# Patient Record
Sex: Male | Born: 1995 | Race: White | Hispanic: No | Marital: Single | Smoking: Never smoker
Health system: Southern US, Community
[De-identification: ages and names within clinical notes are randomized; demographics above are authoritative.]

## PROBLEM LIST (undated history)

## (undated) DIAGNOSIS — F909 Attention-deficit hyperactivity disorder, unspecified type: Secondary | ICD-10-CM

## (undated) HISTORY — PX: ULNAR COLLATERAL LIGAMENT REPAIR: SHX6159

## (undated) HISTORY — DX: Attention-deficit hyperactivity disorder, unspecified type: F90.9

## (undated) HISTORY — PX: ORIF ELBOW FRACTURE: SUR928

---
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
## 2014-03-03 ENCOUNTER — Other Ambulatory Visit: Payer: Self-pay

## 2014-03-03 DIAGNOSIS — F988 Other specified behavioral and emotional disorders with onset usually occurring in childhood and adolescence: Secondary | ICD-10-CM

## 2014-03-03 NOTE — Telephone Encounter (Signed)
The patient's mother called about a prescription refill for her son's Ativan.  CB#: [phone]

## 2014-03-04 MED ORDER — METHYLPHENIDATE HCL 10 MG PO TABS: 10.0000 mg | ORAL_TABLET | Freq: Two times a day (BID) | ORAL | Status: DC

## 2014-03-04 NOTE — Telephone Encounter (Signed)
Could you write this for me since I am gone for 4 days.  Thanks.

## 2014-03-04 NOTE — Telephone Encounter (Signed)
Ritalin and not Ativan

## 2014-03-04 NOTE — Telephone Encounter (Signed)
Notified mother ready. 

## 2014-03-04 NOTE — Telephone Encounter (Signed)
Meds ordered this encounter  Medications  . methylphenidate (RITALIN) 10 MG tablet    Sig: Take 1 tablet (10 mg total) by mouth 2 (two) times daily.    Dispense:  60 tablet    Refill:  0

## 2014-07-19 ENCOUNTER — Telehealth: Payer: Self-pay

## 2014-07-19 DIAGNOSIS — F988 Other specified behavioral and emotional disorders with onset usually occurring in childhood and adolescence: Secondary | ICD-10-CM

## 2014-07-19 NOTE — Telephone Encounter (Signed)
pts mom states son needs adderall refill    Best phone for mom is [phone]

## 2014-07-21 NOTE — Telephone Encounter (Signed)
We have not seen the patient since 4/15 and at that time he was put on Ritalin and it looks like his last Rx was 02/2014.  Could we get more information please.  He needs to be seen.

## 2014-07-22 NOTE — Telephone Encounter (Signed)
PATIENT'S MOTHER STATES SHE LEFT A PHONE MESSAGE ON  MARCH 29 TH ASKING TO GET A REFILL ON HER SON'S ADDERALL. SHE HAS NEVER HEARD BACK FROM US. I READ THE MESSAGE THAT [name] LEFT AND HIS MOTHER SAID THAT HE IS IN COLLEGE AND HE HAS NOT FILLED ANY PRESCRIPTIONS SINCE HE WAS IN THE OFFICE LAST. SHE SAID HE JUST NEEDS ENOUGH TO LAST UNTIL HE FINISHES HIS EXAMS AT THE END OF THE MONTH. THEN HE WILL BE ABLE TO COME IN TO HAVE A PHYSICAL.  BEST PHONE [phone] (MOTHER'S NAME IS LISA COOPER) PHARMACY CHOICE IS [facility] ([place]) [facility]

## 2014-07-25 NOTE — Telephone Encounter (Signed)
Gavin Torres states her son is having his exams and really need the ADDERALL to get through, will be in after that for his PE. Please call [phone]

## 2014-07-26 MED ORDER — METHYLPHENIDATE HCL 10 MG PO TABS: 10.0000 mg | ORAL_TABLET | Freq: Two times a day (BID) | ORAL | Status: DC

## 2014-07-26 NOTE — Telephone Encounter (Signed)
Rx printed. Authorized on Gavin Torres's behalf, as she is out of the office. She'll see Gavin Torres when he's home from school after exams. Encourage mom to go ahead and schedule that visit.  Meds ordered this encounter  Medications  . methylphenidate (RITALIN) 10 MG tablet    Sig: Take 1 tablet (10 mg total) by mouth 2 (two) times daily.    Dispense:  60 tablet    Refill:  0    Order Specific Question:  Supervising Provider    Answer:  DOOLITTLE, ROBERT P [[id number]]

## 2014-07-27 NOTE — Telephone Encounter (Signed)
Called mom advised Rx ready to pick up. Advised to make appt. Left message.

## 2014-12-06 ENCOUNTER — Telehealth: Payer: Self-pay | Admitting: Physician Assistant

## 2014-12-06 NOTE — Telephone Encounter (Signed)
Patient's mother called to see if Gavin Torres had a chance to review message regarding med refills. Patient is leaving the end of this week for college and they really need med before he leaves. Can another provider review message and respond? Cb# [phone].

## 2014-12-06 NOTE — Telephone Encounter (Signed)
Gavin Torres,  (MRN [id number]) Gavin Torres   Per mother [name] [phone]), which was not happy about the appointment being cancelled.  Patient is leaving for college on Saturday.  Due to clinic being cancelled on 12/07/2014 @ 3:45pm, patient will not be able to make another appointment.  I did offer Dr. Katrinka Blazing at 10:00 am, but patient could not make that appointment since he would be [place] from the beach.  Patient wanted to up his Ritalin which he only takes while studying otherwise he does fine while in class.    [name] would appreciate if you could please call her.  They will need the prescription by Friday to make sure it is filled before they leave on Saturday.  Pharmacy - [facility] - [phone] methylphenidate (RITALIN) 10 MG tablet

## 2014-12-07 ENCOUNTER — Ambulatory Visit: Payer: Self-pay | Admitting: Physician Assistant

## 2014-12-07 NOTE — Telephone Encounter (Signed)
Patient has not been seen here in well over a year. We apologize for [name]'s clinic being cancelled but we cannot refill medication without the patient being seen. If none of the open appointment slots work for them, they will need to come to the walk-in clinic.

## 2014-12-07 NOTE — Telephone Encounter (Signed)
Spoke with mother, she is very upset about this situation. I expressed my apologies. I stuck him to an appt slot with Gurney Maxin tomorrow at 1:15pm

## 2014-12-07 NOTE — Telephone Encounter (Signed)
I have reviewed this message and I am sorry that I had to cancel my appt but they were told In April that he needed an appt and the fact that he wants to increase his dose is even more reason for him to be seen.  He definitely does use sparingly and I think that am increase is ok but we do need to evaluate him since he is on a stimulant and we typically do this every 6 months and in April we made an exception due to him being away in college.

## 2014-12-08 ENCOUNTER — Ambulatory Visit (INDEPENDENT_AMBULATORY_CARE_PROVIDER_SITE_OTHER): Payer: BLUE CROSS/BLUE SHIELD | Admitting: Urgent Care

## 2014-12-08 ENCOUNTER — Encounter: Payer: Self-pay | Admitting: Urgent Care

## 2014-12-08 VITALS — BP 102/70 | HR 81 | Temp 98.2°F | Resp 16 | Ht 75.5 in | Wt 235.0 lb

## 2014-12-08 DIAGNOSIS — F909 Attention-deficit hyperactivity disorder, unspecified type: Secondary | ICD-10-CM | POA: Diagnosis not present

## 2014-12-08 DIAGNOSIS — F988 Other specified behavioral and emotional disorders with onset usually occurring in childhood and adolescence: Secondary | ICD-10-CM

## 2014-12-08 MED ORDER — METHYLPHENIDATE HCL 5 MG PO TABS: 5.0000 mg | ORAL_TABLET | Freq: Every day | ORAL | Status: DC

## 2014-12-08 MED ORDER — METHYLPHENIDATE HCL 10 MG PO TABS: 10.0000 mg | ORAL_TABLET | Freq: Two times a day (BID) | ORAL | Status: DC

## 2014-12-08 MED ORDER — METHYLPHENIDATE HCL 5 MG PO TABS: 5.0000 mg | ORAL_TABLET | Freq: Two times a day (BID) | ORAL | Status: DC

## 2014-12-08 MED ORDER — METHYLPHENIDATE HCL 10 MG PO TABS: 10.0000 mg | ORAL_TABLET | Freq: Every day | ORAL | Status: DC

## 2014-12-08 NOTE — Patient Instructions (Signed)
Methylphenidate tablets What is this medicine? METHYLPHENIDATE (meth il FEN i date) is used to treat attention-deficit hyperactivity disorder (ADHD). It is also used to treat narcolepsy. This medicine may be used for other purposes; ask your health care provider or pharmacist if you have questions. COMMON BRAND NAME(S): Methylin, Ritalin What should I tell my health care provider before I take this medicine? They need to know if you have any of these conditions: -anxiety or panic attacks -circulation problems in fingers and toes -glaucoma -hardening or blockages of the arteries or heart blood vessels -heart disease or a heart defect -high blood pressure -history of a drug or alcohol abuse problem -history of stroke -liver disease -mental illness -motor tics, family history or diagnosis of Tourette's syndrome -seizures -suicidal thoughts, plans, or attempt; a previous suicide attempt by you or a family member -thyroid disease -an unusual or allergic reaction to methylphenidate, other medicines, foods, dyes, or preservatives -pregnant or trying to get pregnant -breast-feeding How should I use this medicine? Take this medicine by mouth with a glass of water. Follow the directions on the prescription label. It is best to take this medicine 30 to 45 minutes before meals, unless your doctor tells you otherwise. Take your medicine at regular intervals. Usually the last dose of the day will be taken at least 4 to 6 hours before bedtime, so it will not interfere with sleep. Do not take your medicine more often than directed. A special MedGuide will be given to you by the pharmacist with each prescription and refill. Be sure to read this information carefully each time. Talk to your pediatrician regarding the use of this medicine in children. While this drug may be prescribed for children as young as 6 years of age for selected conditions, precautions do apply. Overdosage: If you think you have  taken too much of this medicine contact a poison control center or emergency room at once. NOTE: This medicine is only for you. Do not share this medicine with others. What if I miss a dose? If you miss a dose, take it as soon as you can. If it is almost time for your next dose, take only that dose. Do not take double or extra doses. What may interact with this medicine? Do not take this medicine with any of the following medications: -lithium -MAOIs like Carbex, Eldepryl, Marplan, Nardil, and Parnate -other stimulant medicines for attention disorders, weight loss, or to stay awake -procarbazine This medicine may also interact with the following medications: -atomoxetine -caffeine -certain medicines for blood pressure, heart disease, irregular heart beat -certain medicines for depression, anxiety, or psychotic disturbances -certain medicines for seizures like carbamazepine, phenobarbital, phenytoin -cold or allergy medicines -warfarin This list may not describe all possible interactions. Give your health care provider a list of all the medicines, herbs, non-prescription drugs, or dietary supplements you use. Also tell them if you smoke, drink alcohol, or use illegal drugs. Some items may interact with your medicine. What should I watch for while using this medicine? Visit your doctor or health care professional for regular checks on your progress. This prescription requires that you follow special procedures with your doctor and pharmacy. You will need to have a new written prescription from your doctor or health care professional every time you need a refill. This medicine may affect your concentration, or hide signs of tiredness. Until you know how this drug affects you, do not drive, ride a bicycle, use machinery, or do anything that needs mental alertness.   Tell your doctor or health care professional if this medicine loses its effects, or if you feel you need to take more than the  prescribed amount. Do not change the dosage without talking to your doctor or health care professional. For males, contact your doctor or health care professional right away if you have an erection that lasts longer than 4 hours or if it becomes painful. This may be a sign of a serious problem and must be treated right away to prevent permanent damage. Decreased appetite is a common side effect when starting this medicine. Eating small, frequent meals or snacks can help. Talk to your doctor if you continue to have poor eating habits. Height and weight growth of a child taking this medicine will be monitored closely. Do not take this medicine close to bedtime. It may prevent you from sleeping. If you are going to need surgery, a MRI, CT scan, or other procedure, tell your doctor that you are taking this medicine. You may need to stop taking this medicine before the procedure. Tell your doctor or healthcare professional right away if you notice unexplained wounds on your fingers and toes while taking this medicine. You should also tell your healthcare provider if you experience numbness or pain, changes in the skin color, or sensitivity to temperature in your fingers or toes. What side effects may I notice from receiving this medicine? Side effects that you should report to your doctor or health care professional as soon as possible: -allergic reactions like skin rash, itching or hives, swelling of the face, lips, or tongue -changes in vision -chest pain or chest tightness -fast, irregular heartbeat -fingers or toes feel numb, cool, painful -hallucination, loss of contact with reality -high blood pressure -males: prolonged or painful erection -seizures -severe headaches -shortness of breath -suicidal thoughts or other mood changes -trouble walking, dizziness, loss of balance or coordination -uncontrollable head, mouth, neck, arm, or leg movements -unusual bleeding or bruising Side effects that  usually do not require medical attention (report to your doctor or health care professional if they continue or are bothersome): -anxious -headache -loss of appetite -nausea, vomiting -trouble sleeping -weight loss This list may not describe all possible side effects. Call your doctor for medical advice about side effects. You may report side effects to FDA at 1-800-FDA-1088. Where should I keep my medicine? Keep out of the reach of children. This medicine can be abused. Keep your medicine in a safe place to protect it from theft. Do not share this medicine with anyone. Selling or giving away this medicine is dangerous and against the law. Store at room temperature between 15 and 30 degrees C (59 and 86 degrees F). Protect from light and moisture. Keep container tightly closed. Throw away any unused medicine after the expiration date. NOTE: This sheet is a summary. It may not cover all possible information. If you have questions about this medicine, talk to your doctor, pharmacist, or health care provider.  2015, Elsevier/Gold Standard. (2012-12-28 10:09:08)     Attention Deficit Hyperactivity Disorder Attention deficit hyperactivity disorder (ADHD) is a problem with behavior issues based on the way the brain functions (neurobehavioral disorder). It is a common reason for behavior and academic problems in school. SYMPTOMS  There are 3 types of ADHD. The 3 types and some of the symptoms include:  Inattentive.  Gets bored or distracted easily.  Loses or forgets things. Forgets to hand in homework.  Has trouble organizing or completing tasks.  Difficulty staying on  task.  An inability to organize daily tasks and school work.  Leaving projects, chores, or homework unfinished.  Trouble paying attention or responding to details. Careless mistakes.  Difficulty following directions. Often seems like is not listening.  Dislikes activities that require sustained attention (like chores  or homework).  Hyperactive-impulsive.  Feels like it is impossible to sit still or stay in a seat. Fidgeting with hands and feet.  Trouble waiting turn.  Talking too much or out of turn. Interruptive.  Speaks or acts impulsively.  Aggressive, disruptive behavior.  Constantly busy or on the go; noisy.  Often leaves seat when they are expected to remain seated.  Often runs or climbs where it is not appropriate, or feels very restless.  Combined.  Has symptoms of both of the above. Often children with ADHD feel discouraged about themselves and with school. They often perform well below their abilities in school. As children get older, the excess motor activities can calm down, but the problems with paying attention and staying organized persist. Most children do not outgrow ADHD but with good treatment can learn to cope with the symptoms. DIAGNOSIS  When ADHD is suspected, the diagnosis should be made by professionals trained in ADHD. This professional will collect information about the individual suspected of having ADHD. Information must be collected from various settings where the person lives, works, or attends school.  Diagnosis will include:  Confirming symptoms began in childhood.  Ruling out other reasons for the child's behavior.  The health care providers will check with the child's school and check their medical records.  They will talk to teachers and parents.  Behavior rating scales for the child will be filled out by those dealing with the child on a daily basis. A diagnosis is made only after all information has been considered. TREATMENT  Treatment usually includes behavioral treatment, tutoring or extra support in school, and stimulant medicines. Because of the way a person's brain works with ADHD, these medicines decrease impulsivity and hyperactivity and increase attention. This is different than how they would work in a person who does not have ADHD. Other  medicines used include antidepressants and certain blood pressure medicines. Most experts agree that treatment for ADHD should address all aspects of the person's functioning. Along with medicines, treatment should include structured classroom management at school. Parents should reward good behavior, provide constant discipline, and set limits. Tutoring should be available for the child as needed. ADHD is a lifelong condition. If untreated, the disorder can have long-term serious effects into adolescence and adulthood. HOME CARE INSTRUCTIONS   Often with ADHD there is a lot of frustration among family members dealing with the condition. Blame and anger are also feelings that are common. In many cases, because the problem affects the family as a whole, the entire family may need help. A therapist can help the family find better ways to handle the disruptive behaviors of the person with ADHD and promote change. If the person with ADHD is young, most of the therapist's work is with the parents. Parents will learn techniques for coping with and improving their child's behavior. Sometimes only the child with the ADHD needs counseling. Your health care providers can help you make these decisions.  Children with ADHD may need help learning how to organize. Some helpful tips include:  Keep routines the same every day from wake-up time to bedtime. Schedule all activities, including homework and playtime. Keep the schedule in a place where the person with ADHD  will often see it. Mark schedule changes as far in advance as possible.  Schedule outdoor and indoor recreation.  Have a place for everything and keep everything in its place. This includes clothing, backpacks, and school supplies.  Encourage writing down assignments and bringing home needed books. Work with your child's teachers for assistance in organizing school work.  Offer your child a well-balanced diet. Breakfast that includes a balance of whole  grains, protein, and fruits or vegetables is especially important for school performance. Children should avoid drinks with caffeine including:  Soft drinks.  Coffee.  Tea.  However, some older children (adolescents) may find these drinks helpful in improving their attention. Because it can also be common for adolescents with ADHD to become addicted to caffeine, talk with your health care provider about what is a safe amount of caffeine intake for your child.  Children with ADHD need consistent rules that they can understand and follow. If rules are followed, give small rewards. Children with ADHD often receive, and expect, criticism. Look for good behavior and praise it. Set realistic goals. Give clear instructions. Look for activities that can foster success and self-esteem. Make time for pleasant activities with your child. Give lots of affection.  Parents are their children's greatest advocates. Learn as much as possible about ADHD. This helps you become a stronger and better advocate for your child. It also helps you educate your child's teachers and instructors if they feel inadequate in these areas. Parent support groups are often helpful. A national group with local chapters is called Children and Adults with Attention Deficit Hyperactivity Disorder (CHADD). SEEK MEDICAL CARE IF:  Your child has repeated muscle twitches, cough, or speech outbursts.  Your child has sleep problems.  Your child has a marked loss of appetite.  Your child develops depression.  Your child has new or worsening behavioral problems.  Your child develops dizziness.  Your child has a racing heart.  Your child has stomach pains.  Your child develops headaches. SEEK IMMEDIATE MEDICAL CARE IF:  Your child has been diagnosed with depression or anxiety and the symptoms seem to be getting worse.  Your child has been depressed and suddenly appears to have increased energy or motivation.  You are worried  that your child is having a bad reaction to a medication he or she is taking for ADHD. Document Released: 03/29/2002 Document Revised: 04/13/2013 Document Reviewed: 12/14/2012 [facility] Patient Information 2015 [place], [place]. This information is not intended to replace advice given to you by your health care provider. Make sure you discuss any questions you have with your health care provider.

## 2014-12-08 NOTE — Progress Notes (Signed)
MRN: [id number] DOB: 27-Apr-1995  Subjective:   Gavin Torres is a 19 y.o. male presenting for chief complaint of Medication Refill  Patient has a longstanding diagnosis of ADD. Has been managed with Ritalin. Has also tried Concerta and Metadate in the past, switched from Concerta due to cost and did not tolerate Metadate. Patient is playing [employer] and presents forms from [employer] that need to be completed in order for patient to be qualified to play. Reports that he has done well with this medication. He has taken Ritalin since 07/2013. His history of ADD includes significant difficulty with procrastination, attention in getting school work done, completing tasks that require focus and attention to detail, difficulty holding conversations, interrupting others, losing important items such as ID and wallet regularly, significant distractibility, makes careless mistakes. He also fidgets and has difficulty staying still in his chair. However, he has not had the diagnosis of ADHD. The majority of his symptoms are significantly improved with his current medication. He denies mood swings, irritability, decreased appetite, heart racing or palpitations, n/v, abdominal pain, disturbance in his sleep. Patient is requesting a change to his dosing. Reports that he has taken his medication as previously prescribed (twice daily) but would like to try the dose just in the morning given that is when he has most of his classes are. Specifically, he would like a trial of  in the morning. Denies any other aggravating or relieving factors, no other questions or concerns.  Gavin Torres has a current medication list which includes the following prescription(s): methylphenidate and methylphenidate. Also is allergic to vicodin.  Gavin Torres  has a past medical history of ADHD (attention deficit hyperactivity disorder). Also  has past surgical history that includes Ulnar collateral ligament repair (Left) and ORIF elbow  fracture.  Objective:   Vitals: BP 102/70 mmHg  Pulse 81  Temp(Src) 98.2 F (36.8 C) (Oral)  Resp 16  Ht 6' 3.5" (1.918 m)  Wt 235 lb (106.595 kg)  BMI 28.98 kg/m2  Physical Exam  Constitutional: He is oriented to person, place, and time. He appears well-developed and well-nourished.  HENT:  Mouth/Throat: Oropharynx is clear and moist.  Eyes: Pupils are equal, round, and reactive to light. No scleral icterus.  Neck: Normal range of motion. Neck supple. No thyromegaly present.  Cardiovascular: Normal rate, regular rhythm and intact distal pulses.  Exam reveals no gallop and no friction rub.   No murmur heard. Pulmonary/Chest: No respiratory distress. He has no wheezes. He has no rales.  Abdominal: Soft. Bowel sounds are normal. He exhibits no distension and no mass. There is no tenderness.  Neurological: He is alert and oriented to person, place, and time.  Skin: Skin is warm and dry. No rash noted. No erythema. No pallor.  Psychiatric: His mood appears not anxious. His affect is not labile. His speech is not rapid and/or pressured and not tangential. He is not agitated, not aggressive and not hyperactive. He does not exhibit a depressed mood. He expresses no homicidal and no suicidal ideation.  Patient fidgets and has difficulty sitting still.    Assessment and Plan :   1. ADD (attention deficit disorder) - Stable, continue medical therapy with Ritalin. Will switch his dose to  in the morning. Script for 3 months was provided. Patient is to follow up with PA-[name], his PCP. I will complete his [employer] paperwork and send to appropriate staff. - methylphenidate (RITALIN) 10 MG tablet; Take 1 tablet (10 mg total) by mouth daily. Do not  fill before 02/07/2015.  Dispense: 30 tablet; Refill: 0 - methylphenidate (RITALIN) 5 MG tablet; Take 1 tablet (5 mg total) by mouth daily. Do not fill before 02/07/2015.  Dispense: 30 tablet; Refill: 0   Wallis Bamberg, PA-C [facility] [phone] 12/08/2014 9:17 PM

## 2015-04-25 ENCOUNTER — Telehealth: Payer: Self-pay

## 2015-04-25 DIAGNOSIS — F988 Other specified behavioral and emotional disorders with onset usually occurring in childhood and adolescence: Secondary | ICD-10-CM

## 2015-04-25 NOTE — Telephone Encounter (Signed)
Pt req. Refill on...  methylphenidate (RITALIN) 10 MG tablet   [phone]

## 2015-04-27 MED ORDER — METHYLPHENIDATE HCL 10 MG PO TABS: 10.0000 mg | ORAL_TABLET | Freq: Every day | ORAL | Status: DC

## 2015-04-27 MED ORDER — METHYLPHENIDATE HCL 5 MG PO TABS: 5.0000 mg | ORAL_TABLET | Freq: Every day | ORAL | Status: DC

## 2015-04-27 NOTE — Telephone Encounter (Signed)
Please advise patient that he needs OV with Ms. Weber for additional fills.  Meds ordered this encounter  Medications  . methylphenidate (RITALIN) 10 MG tablet    Sig: Take 1 tablet (10 mg total) by mouth daily.    Dispense:  30 tablet    Refill:  0    Order Specific Question:  Supervising Provider    Answer:  DOOLITTLE, ROBERT P [[id number]]  . methylphenidate (RITALIN) 5 MG tablet    Sig: Take 1 tablet (5 mg total) by mouth daily.    Dispense:  30 tablet    Refill:  0    Order Specific Question:  Supervising Provider    Answer:  DOOLITTLE, ROBERT P [[id number]]

## 2015-04-28 NOTE — Telephone Encounter (Signed)
Pt.notified

## 2015-08-20 IMAGING — CR DG SHOULDER 2+V*L*
3 series · 3 of 3 positions shown · non-contrast
Comparison: None.

CLINICAL DATA: Shoulder pain.  Previous dislocation.

LEFT SHOULDER - 2+ VIEW

[AP]
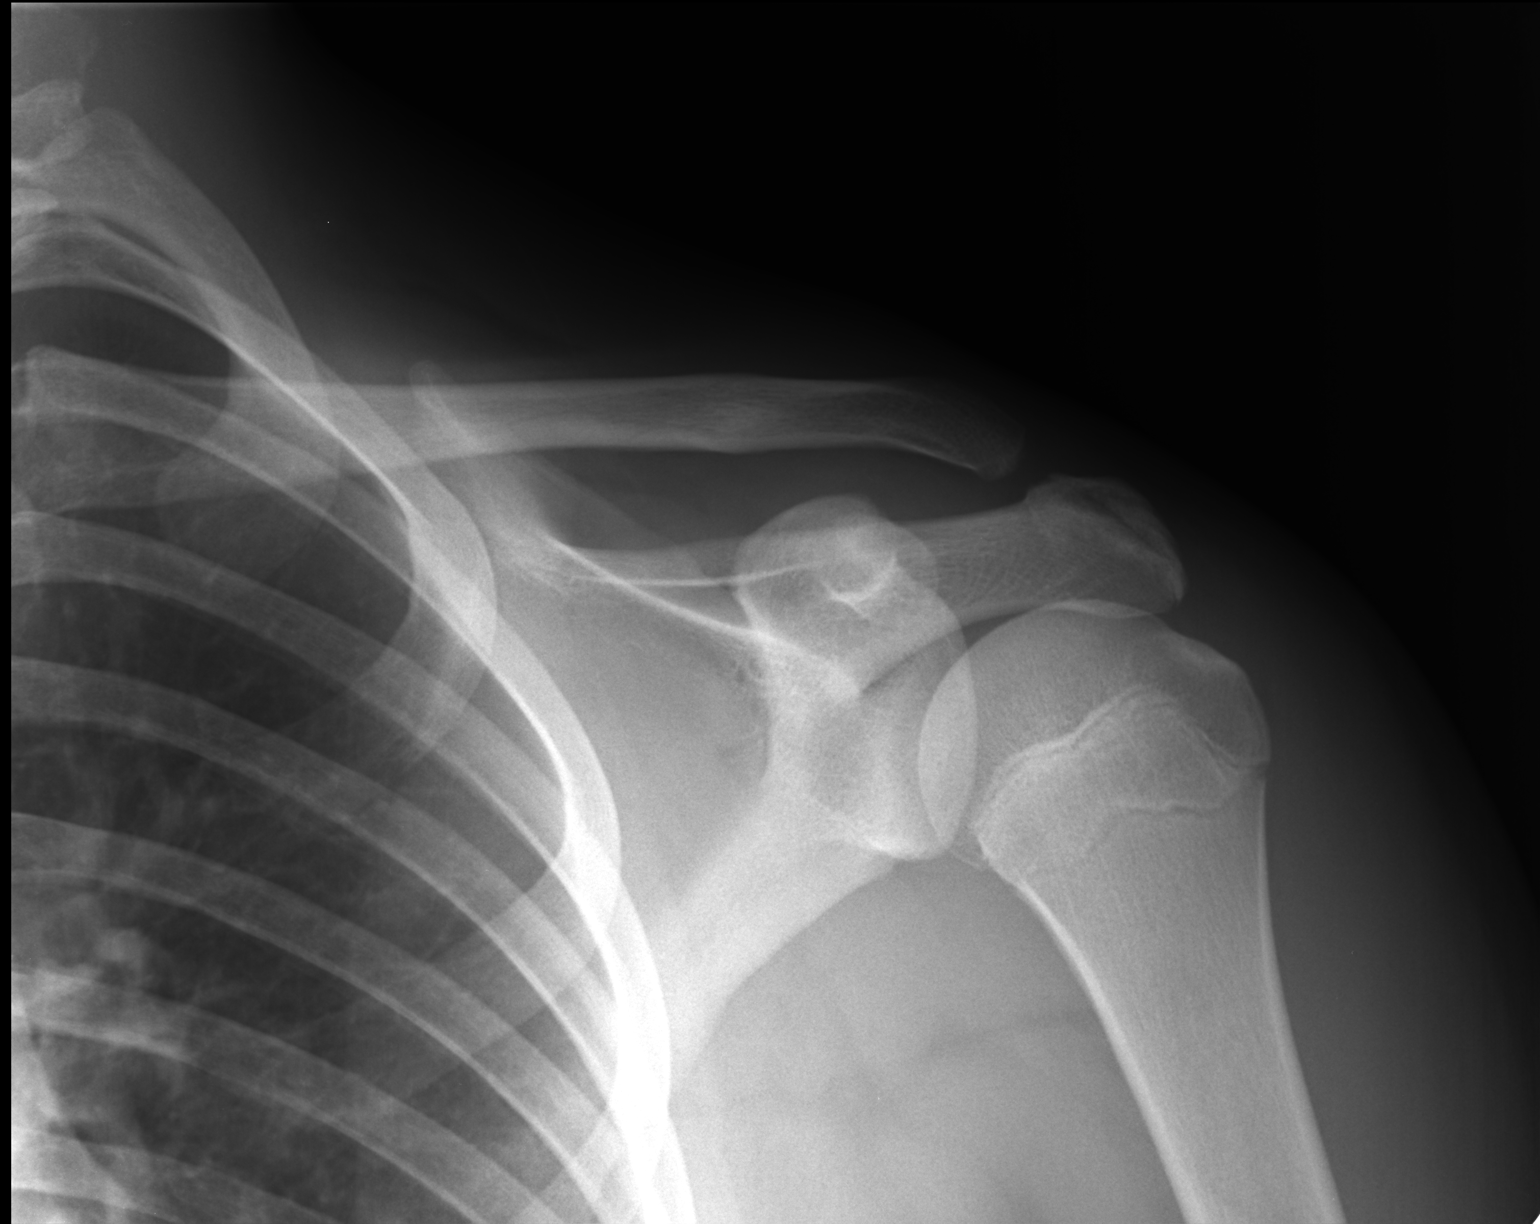

[ap ext rot]
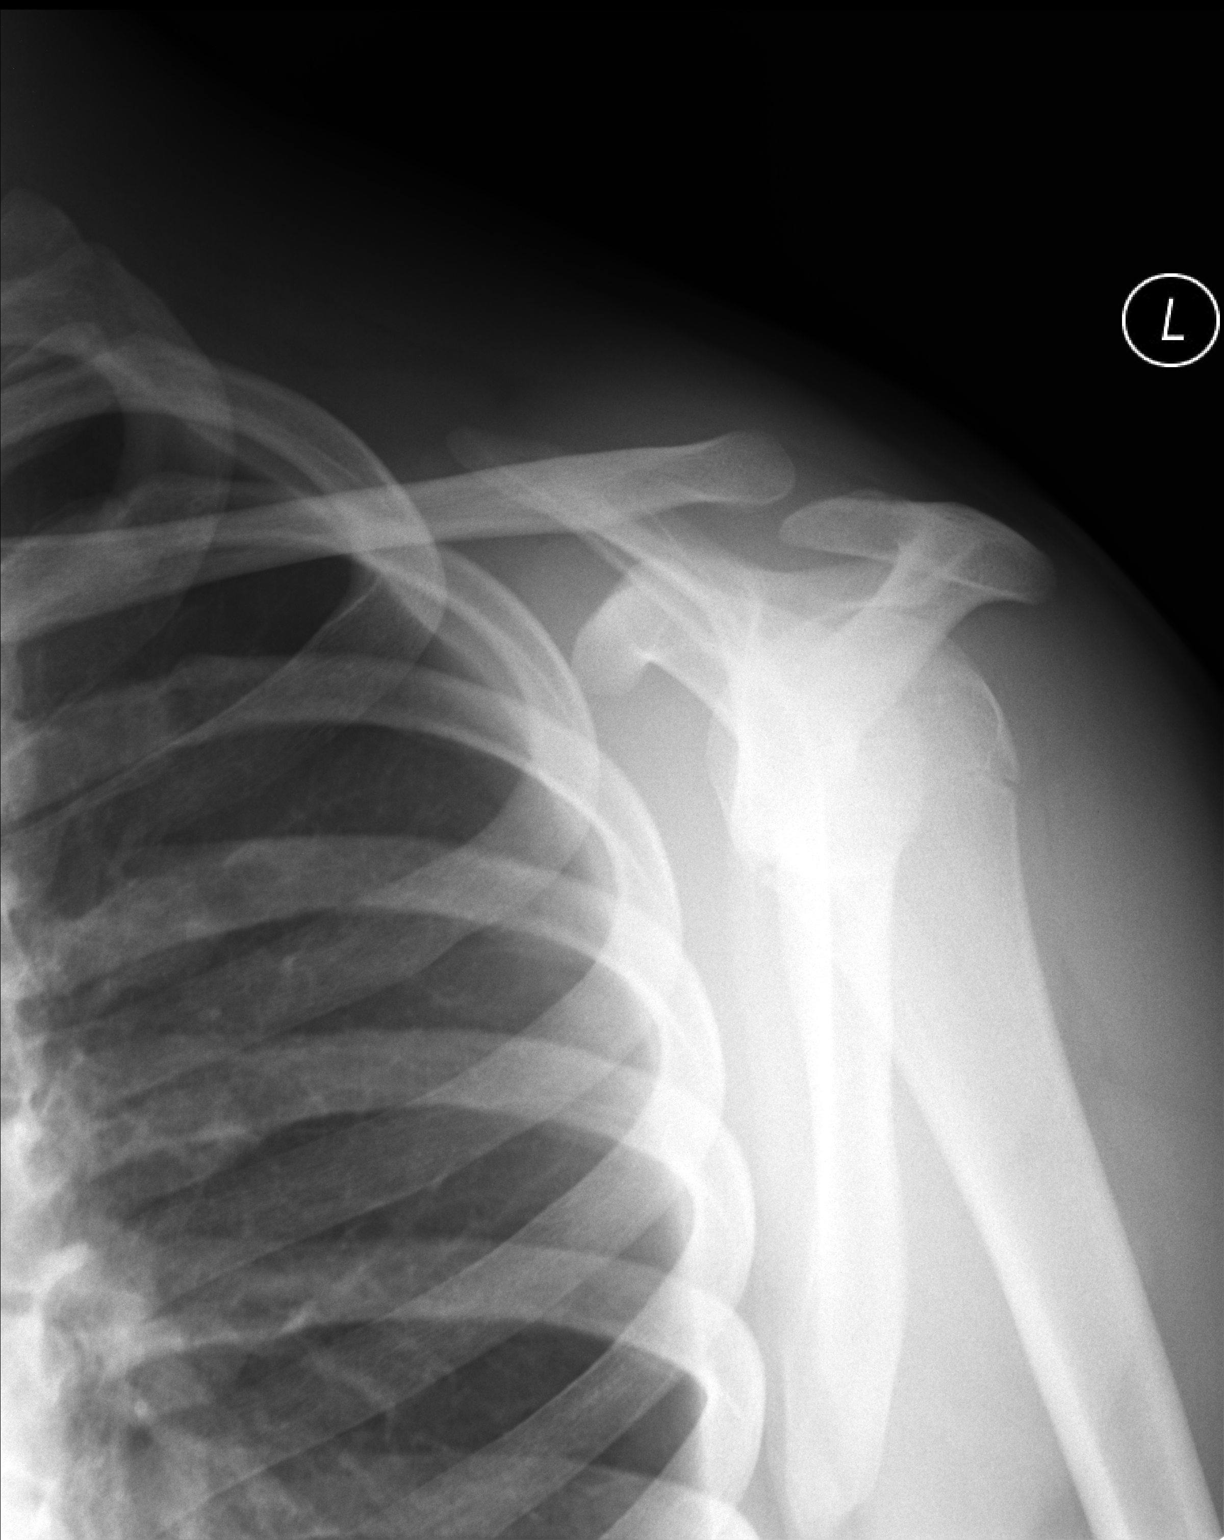

[ap int rot]
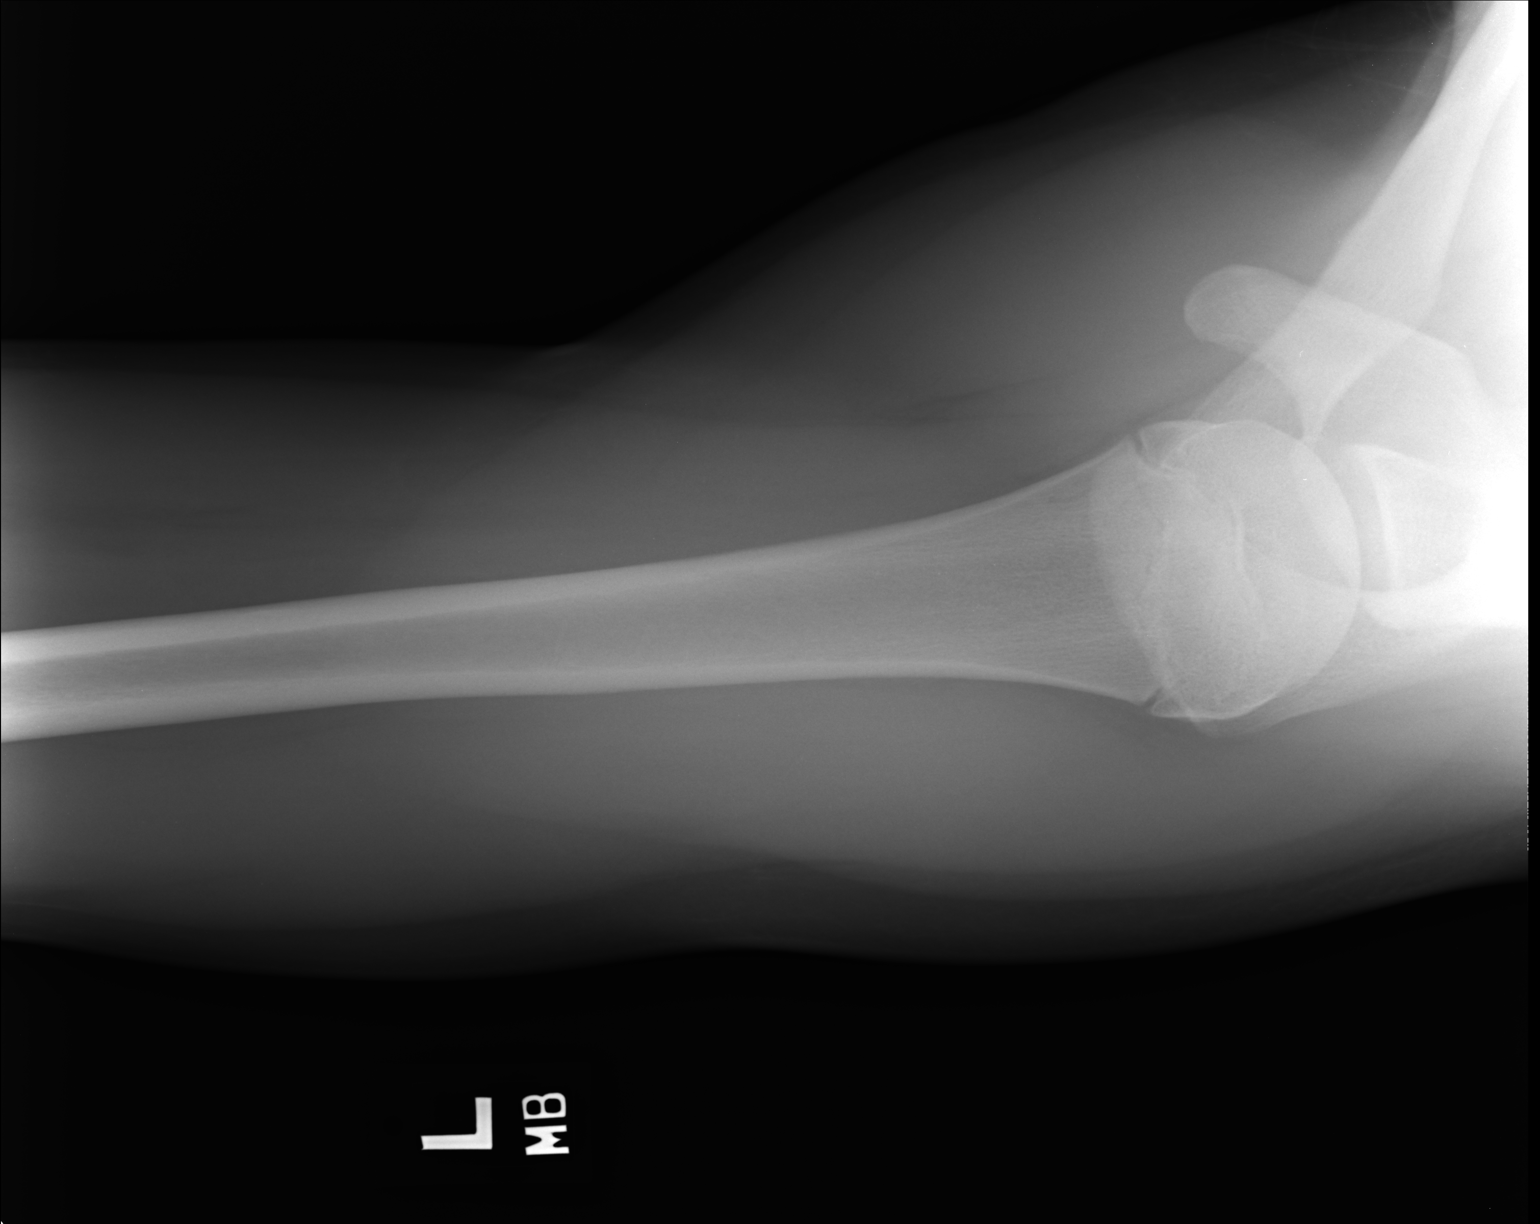

[3 of 3 positions shown; findings below may reference images not displayed]

FINDINGS: Bones, joint spaces and soft tissues are within normal
without fracture or dislocation.
IMPRESSION: No acute findings.

Clinically significant discrepancy from primary report, if
provided: None

## 2017-04-08 ENCOUNTER — Encounter: Payer: Self-pay | Admitting: Urgent Care

## 2017-04-08 ENCOUNTER — Ambulatory Visit: Payer: BLUE CROSS/BLUE SHIELD | Admitting: Urgent Care

## 2017-04-08 VITALS — BP 148/76 | HR 76 | Temp 97.9°F | Resp 16 | Ht 75.5 in | Wt 228.2 lb

## 2017-04-08 DIAGNOSIS — F988 Other specified behavioral and emotional disorders with onset usually occurring in childhood and adolescence: Secondary | ICD-10-CM

## 2017-04-08 NOTE — Progress Notes (Signed)
  MRN: [id number] DOB: [birth year]  Subjective:   Gavin Torres is a 21 y.o. male presenting for medication refill of Ritalin. He has been out of his medication Ritalin. Patient has a longstanding diagnosis of ADD. Has been managed with Ritalin. For extensive review of his ADD management, refer to notes from 12/08/2014. Has obtained his refills from [place]. Cordie [place]Baggett [place], [place]. He would like to consider changing his medication. Reports that he has not felt benefits of being on Ritalin this past semester. Has felt heart racing and a little agitation. Denies chest pain, mood disturbance, belly pain, decreased appetite. He is done with the semester, his GPA dropped from 3.4 to 3.2. Would like to have his medication regimen squared away before starting grad school this summer.  Gavin Torres is allergic to vicodin [hydrocodone-acetaminophen].  Gavin Torres  has a past medical history of ADHD (attention deficit hyperactivity disorder). Also  has a past surgical history that includes Ulnar collateral ligament repair (Left) and ORIF elbow fracture.  Objective:   Vitals: BP (!) 148/76   Pulse 76   Temp 97.9 F (36.6 C) (Oral)   Resp 16   Ht 6' 3.5" (1.918 m)   Wt 228 lb 3.2 oz (103.5 kg)   SpO2 98%   BMI 28.15 kg/m   Physical Exam  Constitutional: He is oriented to person, place, and time. He appears well-developed and well-nourished.  Cardiovascular: Normal rate.  Pulmonary/Chest: Effort normal.  Neurological: He is alert and oriented to person, place, and time.  Psychiatric: He has a normal mood and affect.   Assessment and Plan :    Attention deficit disorder, unspecified hyperactivity presence - Plan: Ambulatory referral to Psychiatry   Given history of failed treatment with his ADD, I will refer to [facility] for better management. Patient is in agreement with plan.

## 2017-04-08 NOTE — Patient Instructions (Addendum)
Attention Deficit Hyperactivity Disorder, Pediatric Attention deficit hyperactivity disorder (ADHD) is a condition that can make it hard for a child to pay attention and concentrate or to control his or her behavior. The child may also have a lot of energy. ADHD is a disorder of the brain (neurodevelopmental disorder), and symptoms are typically first seen in early childhood. It is a common reason for behavioral and academic problems in school. There are three main types of ADHD:  Inattentive. With this type, children have difficulty paying attention.  Hyperactive-impulsive. With this type, children have a lot of energy and have difficulty controlling their behavior.  Combination. This type involves having symptoms of both of the other types.  ADHD is a lifelong condition. If it is not treated, the disorder can affect a child's future academic achievement, employment, and relationships. What are the causes? The exact cause of this condition is not known. What increases the risk? This condition is more likely to develop in:  Children who have a first-degree relative, such as a parent or brother or sister, with the condition.  Children who had a low birth weight.  Children whose mothers had problems during pregnancy or used alcohol or tobacco during pregnancy.  Children who have had a brain infection or a head injury.  Children who have been exposed to lead.  What are the signs or symptoms? Symptoms of this condition depend on the type of ADHD. Symptoms are listed here for each type: Inattentive  Problems with organization.  Difficulty staying focused.  Problems completing assignments at school.  Often making simple mistakes.  Problems sustaining mental effort.  Not listening to instructions.  Losing things often.  Forgetting things often.  Being easily distracted. Hyperactive-impulsive  Fidgeting often.  Difficulty sitting still in one's seat.  Talking a  lot.  Talking out of turn.  Interrupting others.  Difficulty relaxing or doing quiet activities.  High energy levels and constant movement.  Difficulty waiting.  Always "on the go." Combination  Having symptoms of both of the other types. Children with ADHD may feel frustrated with themselves and may find school to be particularly discouraging. They often perform below their abilities in school. As children get older, the excess movement can lessen, but the problems with paying attention and staying organized often continue. Most children do not outgrow ADHD, but with good treatment, they can learn to cope with the symptoms. How is this diagnosed? This condition is diagnosed based on a child's symptoms and academic history. The child's health care provider will do a complete assessment. As part of the assessment, the health care provider will ask the child questions and will ask the parents and teachers for their observations of the child. The health care provider looks for specific symptoms of ADHD. Diagnosis will include:  Ruling out other reasons for the child's behavior.  Reviewing behavior rating scales that have been filled out about the child by people who deal with the child on a daily basis.  A diagnosis is made only after all information from multiple people has been considered. How is this treated? Treatment for this condition may include:  Behavior therapy.  Medicines to decrease impulsivity and hyperactivity and to increase attention. Behavior therapy is preferred for children younger than 6 years old. The combination of medicine and behavior therapy is most effective for children older than 6 years of age.  Tutoring or extra support at school.  Techniques for parents to use at home to help manage their child's symptoms   and behavior.  Follow these instructions at home: Eating and drinking  Offer your child a well-balanced diet. Breakfast that includes a balance  of whole grains, protein, and fruits or vegetables is especially important for school performance.  If your child has trouble with hyperactivity, have your child avoid drinks that contain caffeine. These include: ? Soft drinks. ? Coffee. ? Tea.  If your child is older and finds that caffeinated drinks help to improve his or her attention, talk with your child's health care provider about what amount of caffeine intake is a safe for your child. Lifestyle   Make sure your child gets a full night of sleep and regular daily exercise.  Help manage your child's behavior by following the techniques learned in therapy. These may include: ? Looking for good behavior and rewarding it. ? Making rules for behavior that your child can understand and follow. ? Giving clear instructions. ? Responding consistently to your child's challenging behaviors. ? Setting realistic goals. ? Looking for activities that can lead to success and self-esteem. ? Making time for pleasant activities with your child. ? Giving lots of affection.  Help your child learn to be organized. Some ways to do this include: ? Keeping daily schedules the same. Have a regular wake-up time and bedtime for your child. Schedule all activities, including time for homework and time for play. Post the schedule in a place where your child will see it. Mark schedule changes in advance. ? Having a regular place for your child to store items such as clothing, backpacks, and school supplies. ? Encouraging your child to write down school assignments and to bring home needed books. Work with your child's teachers for assistance in organizing school work. General instructions  Learn as much as you can about ADHD. This will improve your ability to help your child and to make sure he or she gets the support needed. It will also help you educate your child's teachers and instructors if they do not feel that they have adequate knowledge or experience  in these areas.  Work with your child's teachers to make sure your child gets the support and extra help that is needed. This may include: ? Tutoring. ? Teacher cues to help your child remain on task. ? Seating changes so your child is working at a desk that is free from distractions.  Give over-the-counter and prescription medicines only as told by your child's health care provider.  Keep all follow-up visits as told by your health care provider. This is important. Contact a health care provider if:  Your child has repeated muscle twitches (tics), coughs, or speech outbursts.  Your child has sleep problems.  Your child has a marked loss of appetite.  Your child develops depression.  Your child has new or worsening behavioral problems.  Your child has dizziness.  Your child has a racing heart.  Your child has stomach pains.  Your child develops headaches. Get help right away if:  Your child talks about or threatens suicide.  You are worried that your child is having a bad reaction to a medicine that he or she is taking for ADHD. This information is not intended to replace advice given to you by your health care provider. Make sure you discuss any questions you have with your health care provider. Document Released: 03/29/2002 Document Revised: 12/06/2015 Document Reviewed: 11/02/2015 [employer] Interactive Patient Education  2017 [employer]     IF you received an x-ray today, you will receive  an Economistinvoice from [facility]. Please contact [facility] at [phone] with questions or concerns regarding your invoice.   IF you received labwork today, you will receive an invoice from [facility]. Please contact [facility] at [phone] with questions or concerns regarding your invoice.   Our billing staff will not be able to assist you with questions regarding bills from these companies.  You will be contacted with the lab results as soon as they are  available. The fastest way to get your results is to activate your My Chart account. Instructions are located on the last page of this paperwork. If you have not heard from us regarding the results in 2 weeks, please contact this office.
